# Patient Record
Sex: Male | Born: 1970 | Race: White | Hispanic: No | Marital: Married | Smoking: Current every day smoker
Health system: Southern US, Community
[De-identification: ages and names within clinical notes are randomized; demographics above are authoritative.]

## PROBLEM LIST (undated history)

## (undated) DIAGNOSIS — E7800 Pure hypercholesterolemia, unspecified: Secondary | ICD-10-CM

## (undated) DIAGNOSIS — F419 Anxiety disorder, unspecified: Secondary | ICD-10-CM

## (undated) DIAGNOSIS — K219 Gastro-esophageal reflux disease without esophagitis: Secondary | ICD-10-CM

## (undated) DIAGNOSIS — I10 Essential (primary) hypertension: Secondary | ICD-10-CM

## (undated) HISTORY — PX: ANAL FISSURE REPAIR: SHX2312

## (undated) HISTORY — PX: BACK SURGERY: SHX140

---

## 2010-07-21 ENCOUNTER — Emergency Department (HOSPITAL_COMMUNITY)
Admission: EM | Admit: 2010-07-21 | Discharge: 2010-07-21 | Payer: Self-pay | Source: Home / Self Care | Admitting: Emergency Medicine

## 2010-07-28 LAB — COMPREHENSIVE METABOLIC PANEL
ALT: 50 U/L (ref 0–53)
AST: 29 U/L (ref 0–37)
Alkaline Phosphatase: 64 U/L (ref 39–117)
BUN: 11 mg/dL (ref 6–23)
CO2: 25 mEq/L (ref 19–32)
Chloride: 104 mEq/L (ref 96–112)
GFR calc non Af Amer: >60 mL/min (ref >60)
Glucose, Bld: 111 mg/dL — ABNORMAL HIGH (ref 70–99)
Potassium: 3.8 mEq/L (ref 3.5–5.1)
Sodium: 137 mEq/L (ref 135–145)
Total Bilirubin: 0.4 mg/dL (ref 0.3–1.2)

## 2010-07-28 LAB — DIFFERENTIAL
Basophils Absolute: 0 10*3/uL (ref 0.0–0.1)
Basophils Relative: 0 % (ref 0–1)
Eosinophils Absolute: 0.1 10*3/uL (ref 0.0–0.7)
Eosinophils Relative: 1 % (ref 0–5)
Lymphocytes Relative: 27 % (ref 12–46)
Lymphs Abs: 1.7 10*3/uL (ref 0.7–4.0)
Monocytes Absolute: 0.2 10*3/uL (ref 0.1–1.0)
Monocytes Relative: 3 % (ref 3–12)
Neutro Abs: 4.2 10*3/uL (ref 1.7–7.7)
Neutrophils Relative %: 66 % (ref 43–77)

## 2010-07-28 LAB — CBC
HCT: 42.7 % (ref 39.0–52.0)
Hemoglobin: 15.1 g/dL (ref 13.0–17.0)
MCHC: 35.4 g/dL (ref 30.0–36.0)
MCV: 90.5 fL (ref 78.0–100.0)
Platelets: 215 10*3/uL (ref 150–400)
RBC: 4.72 MIL/uL (ref 4.22–5.81)
RDW: 12.5 % (ref 11.5–15.5)
WBC: 6.3 10*3/uL (ref 4.0–10.5)

## 2010-07-28 LAB — URINALYSIS, ROUTINE W REFLEX MICROSCOPIC
Bilirubin Urine: NEGATIVE
Hgb urine dipstick: NEGATIVE
Ketones, ur: NEGATIVE mg/dL
Nitrite: NEGATIVE
Protein, ur: NEGATIVE mg/dL
Specific Gravity, Urine: 1.005 (ref 1.005–1.030)
Urine Glucose, Fasting: NEGATIVE mg/dL
Urobilinogen, UA: 0.2 mg/dL (ref 0.0–1.0)
pH: 6 (ref 5.0–8.0)

## 2010-07-28 LAB — LIPASE, BLOOD: Lipase: 56 U/L (ref 11–59)

## 2014-09-14 ENCOUNTER — Other Ambulatory Visit: Payer: Self-pay | Admitting: Neurosurgery

## 2014-09-14 DIAGNOSIS — M5416 Radiculopathy, lumbar region: Secondary | ICD-10-CM

## 2014-10-03 ENCOUNTER — Ambulatory Visit
Admission: RE | Admit: 2014-10-03 | Discharge: 2014-10-03 | Disposition: A | Discharge status: Home / Self Care | Payer: BLUE CROSS/BLUE SHIELD | Source: Ambulatory Visit | Attending: Neurosurgery | Admitting: Neurosurgery

## 2014-10-03 DIAGNOSIS — M5416 Radiculopathy, lumbar region: Secondary | ICD-10-CM

## 2014-10-03 MED ORDER — GADOBENATE DIMEGLUMINE 529 MG/ML IV SOLN
20.0000 mL | Freq: Once | INTRAVENOUS | Status: AC | PRN
  Administered 2014-10-03: 20 mL via INTRAVENOUS

## 2015-03-04 ENCOUNTER — Encounter (HOSPITAL_COMMUNITY): Payer: Self-pay

## 2015-03-04 ENCOUNTER — Emergency Department (HOSPITAL_COMMUNITY): Payer: BLUE CROSS/BLUE SHIELD

## 2015-03-04 ENCOUNTER — Emergency Department (HOSPITAL_COMMUNITY)
Admission: EM | Admit: 2015-03-04 | Discharge: 2015-03-04 | Disposition: A | Discharge status: Home / Self Care | Payer: BLUE CROSS/BLUE SHIELD | Attending: Emergency Medicine | Admitting: Emergency Medicine

## 2015-03-04 DIAGNOSIS — I10 Essential (primary) hypertension: Secondary | ICD-10-CM | POA: Insufficient documentation

## 2015-03-04 DIAGNOSIS — R109 Unspecified abdominal pain: Secondary | ICD-10-CM

## 2015-03-04 DIAGNOSIS — R110 Nausea: Secondary | ICD-10-CM | POA: Diagnosis not present

## 2015-03-04 DIAGNOSIS — Z720 Tobacco use: Secondary | ICD-10-CM | POA: Insufficient documentation

## 2015-03-04 DIAGNOSIS — Z79899 Other long term (current) drug therapy: Secondary | ICD-10-CM | POA: Diagnosis not present

## 2015-03-04 DIAGNOSIS — F419 Anxiety disorder, unspecified: Secondary | ICD-10-CM | POA: Insufficient documentation

## 2015-03-04 DIAGNOSIS — R1012 Left upper quadrant pain: Secondary | ICD-10-CM | POA: Diagnosis not present

## 2015-03-04 DIAGNOSIS — Z8719 Personal history of other diseases of the digestive system: Secondary | ICD-10-CM | POA: Insufficient documentation

## 2015-03-04 DIAGNOSIS — R51 Headache: Secondary | ICD-10-CM | POA: Insufficient documentation

## 2015-03-04 DIAGNOSIS — E780 Pure hypercholesterolemia: Secondary | ICD-10-CM | POA: Insufficient documentation

## 2015-03-04 DIAGNOSIS — R1032 Left lower quadrant pain: Secondary | ICD-10-CM | POA: Diagnosis not present

## 2015-03-04 DIAGNOSIS — Z792 Long term (current) use of antibiotics: Secondary | ICD-10-CM | POA: Diagnosis not present

## 2015-03-04 HISTORY — DX: Essential (primary) hypertension: I10

## 2015-03-04 HISTORY — DX: Gastro-esophageal reflux disease without esophagitis: K21.9

## 2015-03-04 HISTORY — DX: Anxiety disorder, unspecified: F41.9

## 2015-03-04 HISTORY — DX: Pure hypercholesterolemia, unspecified: E78.00

## 2015-03-04 LAB — CBC WITH DIFFERENTIAL/PLATELET
Basophils Absolute: 0 10*3/uL (ref 0.0–0.1)
Basophils Relative: 0 % (ref 0–1)
Eosinophils Absolute: 0.1 10*3/uL (ref 0.0–0.7)
Eosinophils Relative: 1 % (ref 0–5)
HCT: 44.6 % (ref 39.0–52.0)
Hemoglobin: 15.7 g/dL (ref 13.0–17.0)
Lymphocytes Relative: 31 % (ref 12–46)
Lymphs Abs: 2.5 10*3/uL (ref 0.7–4.0)
MCH: 32.8 pg (ref 26.0–34.0)
MCHC: 35.2 g/dL (ref 30.0–36.0)
MCV: 93.1 fL (ref 78.0–100.0)
Monocytes Absolute: 0.5 10*3/uL (ref 0.1–1.0)
Monocytes Relative: 7 % (ref 3–12)
Neutro Abs: 4.9 10*3/uL (ref 1.7–7.7)
Neutrophils Relative %: 61 % (ref 43–77)
Platelets: 220 10*3/uL (ref 150–400)
RBC: 4.79 MIL/uL (ref 4.22–5.81)
RDW: 12.5 % (ref 11.5–15.5)
WBC: 7.9 10*3/uL (ref 4.0–10.5)

## 2015-03-04 LAB — URINALYSIS, ROUTINE W REFLEX MICROSCOPIC
Bilirubin Urine: NEGATIVE
Glucose, UA: NEGATIVE mg/dL
Hgb urine dipstick: NEGATIVE
Ketones, ur: NEGATIVE mg/dL
Leukocytes, UA: NEGATIVE
Nitrite: NEGATIVE
Protein, ur: NEGATIVE mg/dL
Specific Gravity, Urine: <1.005 — ABNORMAL LOW (ref 1.005–1.030)
Urobilinogen, UA: 0.2 mg/dL (ref 0.0–1.0)
pH: 6.5 (ref 5.0–8.0)

## 2015-03-04 LAB — BASIC METABOLIC PANEL
Anion gap: 5 (ref 5–15)
BUN: 13 mg/dL (ref 6–20)
CO2: 28 mmol/L (ref 22–32)
Calcium: 9.1 mg/dL (ref 8.9–10.3)
Chloride: 102 mmol/L (ref 101–111)
Creatinine, Ser: 0.97 mg/dL (ref 0.61–1.24)
GFR calc Af Amer: >60 mL/min (ref >60)
GFR calc non Af Amer: >60 mL/min (ref >60)
Glucose, Bld: 93 mg/dL (ref 65–99)
Potassium: 3.6 mmol/L (ref 3.5–5.1)
Sodium: 135 mmol/L (ref 135–145)

## 2015-03-04 LAB — HEPATIC FUNCTION PANEL
ALT: 36 U/L (ref 17–63)
AST: 27 U/L (ref 15–41)
Albumin: 4.1 g/dL (ref 3.5–5.0)
Alkaline Phosphatase: 60 U/L (ref 38–126)
Bilirubin, Direct: 0.1 mg/dL (ref 0.1–0.5)
Indirect Bilirubin: 0.4 mg/dL (ref 0.3–0.9)
Total Bilirubin: 0.5 mg/dL (ref 0.3–1.2)
Total Protein: 6.9 g/dL (ref 6.5–8.1)

## 2015-03-04 LAB — BRAIN NATRIURETIC PEPTIDE: B Natriuretic Peptide: 11 pg/mL (ref 0.0–100.0)

## 2015-03-04 LAB — LIPASE, BLOOD: Lipase: 41 U/L (ref 22–51)

## 2015-03-04 MED ORDER — IOHEXOL 300 MG/ML  SOLN
100.0000 mL | Freq: Once | INTRAMUSCULAR | Status: AC | PRN
  Administered 2015-03-04: 100 mL via INTRAVENOUS

## 2015-03-04 MED ORDER — HYDROCODONE-ACETAMINOPHEN 5-325 MG PO TABS: 1.0000 | ORAL_TABLET | ORAL | Status: AC | PRN

## 2015-03-04 MED ORDER — IOHEXOL 300 MG/ML  SOLN
25.0000 mL | Freq: Once | INTRAMUSCULAR | Status: AC | PRN
  Administered 2015-03-04: 25 mL via ORAL

## 2015-03-04 MED ORDER — ONDANSETRON HCL 8 MG PO TABS: 8.0000 mg | ORAL_TABLET | Freq: Three times a day (TID) | ORAL | Status: AC | PRN

## 2015-03-04 MED ORDER — SODIUM CHLORIDE 0.9 % IV BOLUS (SEPSIS)
1000.0000 mL | Freq: Once | INTRAVENOUS | Status: AC
  Administered 2015-03-04: 1000 mL via INTRAVENOUS

## 2015-03-04 MED ORDER — SODIUM CHLORIDE 0.9 % IV SOLN
INTRAVENOUS | Status: DC
  Administered 2015-03-04: 12:00:00 via INTRAVENOUS

## 2015-03-04 NOTE — Discharge Instructions (Signed)

## 2015-03-04 NOTE — ED Notes (Signed)
Pt states he was on flagyl, cipro and bentyl but he stopped taking them yesterday because they made him sick on his stomach

## 2015-03-04 NOTE — ED Notes (Signed)
Pt now states he was told he has three spots on his liver. States he has been SOB and not able to work

## 2015-03-04 NOTE — ED Notes (Signed)
Pt is complaining of abdominal pain and dizziness for about two weeks. States he was told at [facility] he has a stomach infection and vertigo. States he was told he needed a colonoscopy and it was to be scheduled this week. States he is tired of fooling with them and he is not getting any better

## 2015-03-04 NOTE — ED Provider Notes (Signed)
CSN: [id number]     Arrival date & time 03/04/15  0914 History  This chart was scribed for Mancel Bale, MD by Jarvis Morgan, ED Scribe. This patient was seen in room APA01/APA01 and the patient's care was started at 11:30 AM.      Chief Complaint  Patient presents with  . Abdominal Pain   The history is provided by the patient. No language interpreter was used.    HPI Comments: Cleveland Yarbro is a 44 y.o. male who presents to the Emergency Department complaining of intermittent, moderate, dizziness onset 2 months. Pt reports associated abdominal pain, nausea, and HA. Pt was at [facility] 2 weeks ago and was told he had a stomach infection and vertigo. He states he has been taking Flagyl, Cipro and dicyclomine but had to discontinue this medication because it was causing him to feel sick. Pt was instructed that he needed a colonoscopy and needed it to be scheduled this week. He states he was told by Dr. Allena Katz that he has 3 spots on his liver. He is a current everyday smoker. He states he has had a workup for seizures but the results did not show any seizure disorder. Pt notes he has a h/o chronic back pain and had back surgery 1 year ago. He denies any constipation.    Past Medical History  Diagnosis Date  . Hypertension   . Anxiety   . High cholesterol   . Acid reflux    Past Surgical History  Procedure Laterality Date  . Back surgery    . Anal fissure repair     No family history on file. Social History  Substance Use Topics  . Smoking status: Current Every Day Smoker  . Smokeless tobacco: None  . Alcohol Use: No    Review of Systems  Gastrointestinal: Positive for nausea and abdominal pain. Negative for constipation.  Neurological: Positive for dizziness and headaches.  All other systems reviewed and are negative.     Allergies  Daypro  Home Medications   Prior to Admission medications   Medication Sig Start Date End Date Taking? Authorizing Provider   ALPRAZolam Prudy Feeler) 0.5 MG tablet Take 0.5 mg by mouth 2 (two) times daily as needed for anxiety.  02/06/15  Yes Historical Provider, MD  ciprofloxacin (CIPRO) 500 MG tablet Take 500 mg by mouth 2 (two) times daily. 02/27/15  Yes Historical Provider, MD  dicyclomine (BENTYL) 10 MG capsule Take 10 mg by mouth 3 (three) times daily as needed for spasms.  02/27/15  Yes Historical Provider, MD  etodolac (LODINE) 400 MG tablet Take 400 mg by mouth 2 (two) times daily as needed for mild pain.  01/05/15  Yes Historical Provider, MD  HYDROcodone-acetaminophen (NORCO) 10-325 MG per tablet Take 1 tablet by mouth every 6 (six) hours as needed. for pain 11/29/14  Yes Historical Provider, MD  lisinopril-hydrochlorothiazide (PRINZIDE,ZESTORETIC) 20-12.5 MG per tablet Take 1 tablet by mouth daily. 02/13/15  Yes Historical Provider, MD  metroNIDAZOLE (FLAGYL) 500 MG tablet Take 500 mg by mouth 3 (three) times daily. 02/27/15  Yes Historical Provider, MD  simvastatin (ZOCOR) 20 MG tablet Take 20 mg by mouth daily. 02/01/15  Yes Historical Provider, MD  diazepam (VALIUM) 5 MG tablet Take 5 mg by mouth every 8 (eight) hours as needed for muscle spasms.  02/02/15   Historical Provider, MD  polyethylene glycol powder (GLYCOLAX/MIRALAX) powder Take 17 g by mouth daily as needed for mild constipation.  02/27/15   Historical Provider, MD  Triage Vitals: BP 110/74 mmHg  Pulse 73  Temp(Src) 97.7 F (36.5 C) (Oral)  Resp 18  Ht 5\' 10"  (1.778 m)  Wt 218 lb (98.884 kg)  BMI 31.28 kg/m2  SpO2 93%  Physical Exam  Constitutional: He is oriented to person, place, and time. He appears well-developed and well-nourished.  HENT:  Head: Normocephalic and atraumatic.  Right Ear: External ear normal.  Left Ear: External ear normal.  Eyes: Conjunctivae and EOM are normal. Pupils are equal, round, and reactive to light.  Neck: Normal range of motion and phonation normal. Neck supple.  Cardiovascular: Normal rate, regular rhythm and  normal heart sounds.   Pulmonary/Chest: Effort normal and breath sounds normal. He exhibits no bony tenderness.  Abdominal: Soft. Bowel sounds are increased. There is tenderness (moderate) in the left upper quadrant and left lower quadrant. There is no rebound.  Musculoskeletal: Normal range of motion.  Neurological: He is alert and oriented to person, place, and time. No cranial nerve deficit or sensory deficit. He exhibits normal muscle tone. Coordination normal.  Skin: Skin is warm, dry and intact.  Psychiatric: He has a normal mood and affect. His behavior is normal. Judgment and thought content normal.  Nursing note and vitals reviewed.   ED Course  Procedures (including critical care time)  Medications  0.9 %  sodium chloride infusion (not administered)  sodium chloride 0.9 % bolus 1,000 mL (1,000 mLs Intravenous New Bag/Given 03/04/15 1042)   Patient Vitals for the past 24 hrs:  BP Temp Temp src Pulse Resp SpO2 Height Weight  03/04/15 1123 106/62 mmHg - - 63 16 97 % - -  03/04/15 1115 - - - 62 16 100 % - -  03/04/15 1191 - - - - - - 5\' 10"  (1.778 m) 218 lb (98.884 kg)  03/04/15 0927 110/74 mmHg 97.7 F (36.5 C) Oral 73 18 93 % - -   DIAGNOSTIC STUDIES: Oxygen Saturation is 93% on RA, adequate by my interpretation.    COORDINATION OF CARE:  2:27 PM Reevaluation with update and discussion. After initial assessment and treatment, an updated evaluation reveals patient is comfortable. He and his wife, state that he has been dizzy, for any months. He has a follow-up plan with his GI doctor, in 2 days time. All findings discussed with patient and family and all questions answered. Glorian Mcdonell L   Note:- Records were requested from his primary care doctor, but never arrived prior to the time of discharge.  Labs Review Labs Reviewed  URINALYSIS, ROUTINE W REFLEX MICROSCOPIC (NOT AT [facility]) - Abnormal; Notable for the following:    Specific Gravity, Urine <1.005 (*)    All other  components within normal limits  CBC WITH DIFFERENTIAL/PLATELET  BASIC METABOLIC PANEL  BRAIN NATRIURETIC PEPTIDE  HEPATIC FUNCTION PANEL  LIPASE, BLOOD    Imaging Review Dg Chest 2 View  03/04/2015   CLINICAL DATA:  Shortness of breath and dizziness 2-3 months. Hypertension, smoker.  EXAM: CHEST  2 VIEW  COMPARISON:  07/21/2010  FINDINGS: Lungs are adequately inflated without consolidation or effusion. Cardiomediastinal silhouette is within normal. Bones and soft tissues are unchanged.  IMPRESSION: No active cardiopulmonary disease.   Electronically Signed   By: Elberta Fortis M.D.   On: 03/04/2015 11:14   I have personally reviewed and evaluated these images and lab results as part of my medical decision-making.   EKG Interpretation   Date/Time:  Monday March 04 2015 09:25:47 EDT Ventricular Rate:  71 PR Interval:  135 QRS Duration: 90 QT Interval:  381 QTC Calculation: 414 R Axis:   67 Text Interpretation:  Sinus rhythm RSR' in V1 or V2, probably normal  variant No old tracing to compare Confirmed by [facility]  MD, Aashrith Eves [phone])  on 03/04/2015 10:45:54 AM      MDM   Final diagnoses:  Abdominal pain, unspecified abdominal location    Chronic abdominal pain, with worsening after starting on antibody. Doubt colitis, serious bacterial infection, metabolic instability or impending vascular collapse.  Nursing Notes Reviewed/ Care Coordinated Applicable Imaging Reviewed Interpretation of Laboratory Data incorporated into ED treatment  The patient appears reasonably screened and/or stabilized for discharge and I doubt any other medical condition or other [facility] requiring further screening, evaluation, or treatment in the ED at this time prior to discharge.  Plan: Home Medications- Norco, Zofran; Home Treatments- Rest; return here if the recommended treatment, does not improve the symptoms; Recommended follow up- PCP prn   I personally performed the services described in this  documentation, which was scribed in my presence. The recorded information has been reviewed and is accurate.       Mancel Bale, MD 03/04/15 [phone]

## 2016-04-16 IMAGING — CT CT ABD-PELV W/ CM
2 of 5 series · 16 of 46 positions shown, 18 images · IV contrast (Omnipaque 300)
Comparison: 07/21/2010

CLINICAL DATA: Abdominal pain, dizziness and nausea 2 weeks.

EXAM:
CT ABDOMEN AND PELVIS WITH CONTRAST
TECHNIQUE: Multidetector CT imaging of the abdomen and pelvis was performed
using the standard protocol following bolus administration of
intravenous contrast.
CONTRAST:  25mL OMNIPAQUE IOHEXOL 300 MG/ML SOLN, 100mL OMNIPAQUE
IOHEXOL 300 MG/ML SOLN

[Series 2: abd_pel_with 5.0 b40f · axial · 0.74mm/px · z∈[-520,-50]mm · 13 of 108 slices shown, 15 images]
[im 7/108  soft-tissue]
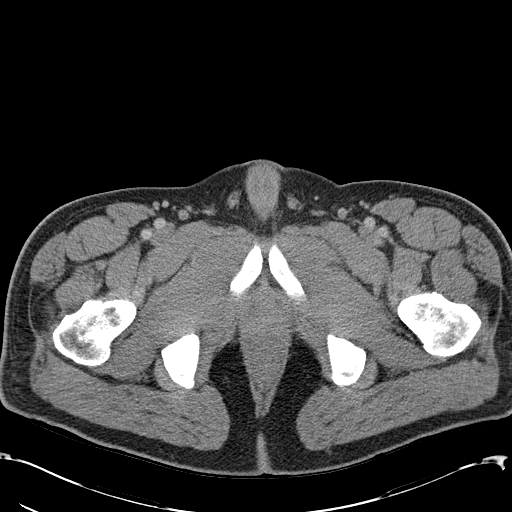
[im 7/108  bone]
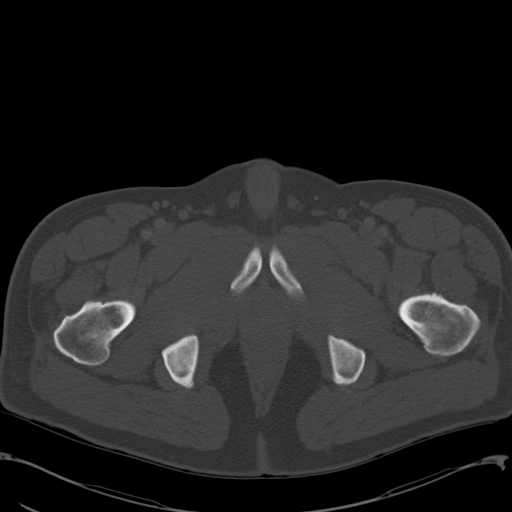
[im 13/108  soft-tissue]
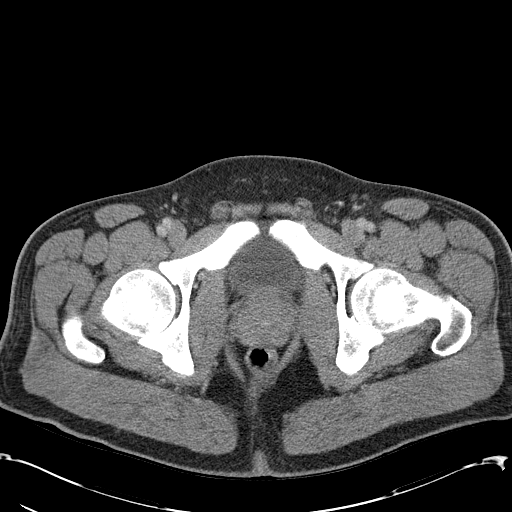
[im 26/108  soft-tissue]
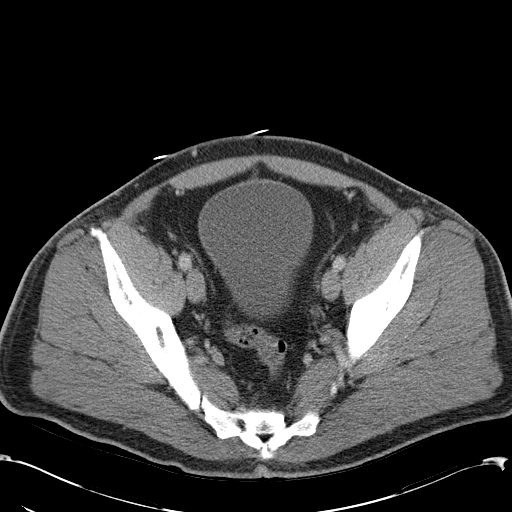
[im 32/108  soft-tissue]
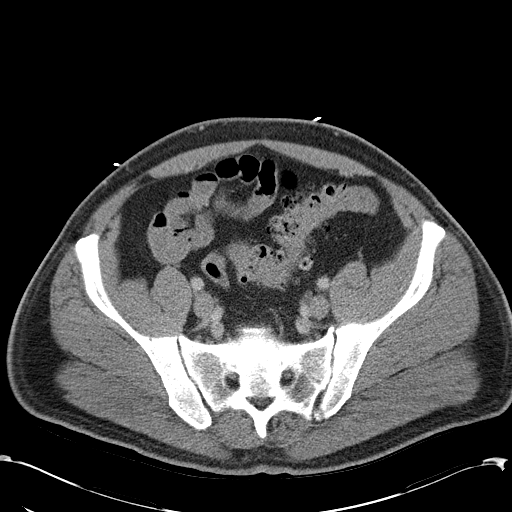
[im 38/108  soft-tissue]
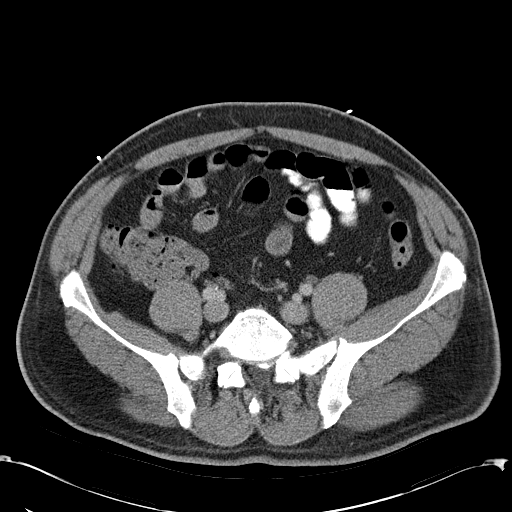
[im 45/108  soft-tissue]
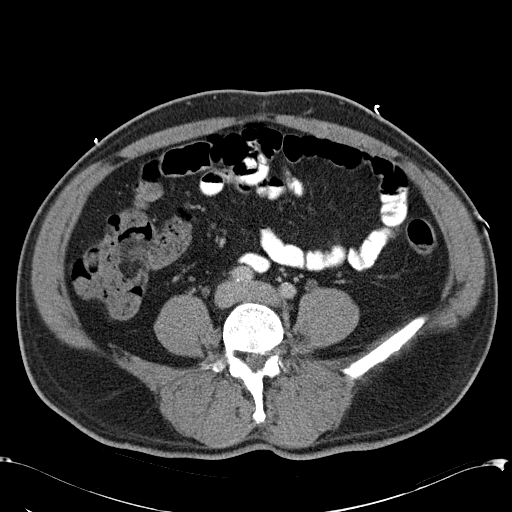
[im 57/108  soft-tissue]
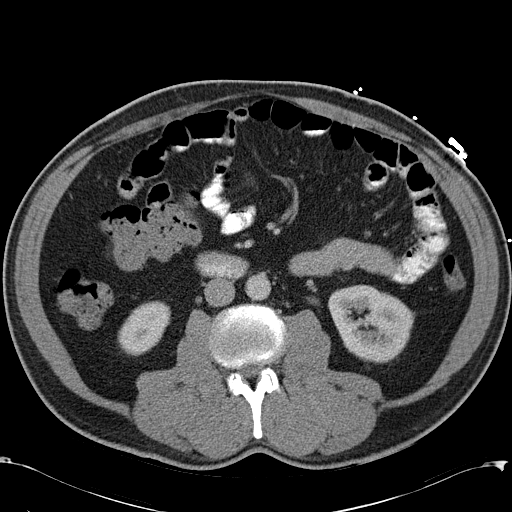
[im 63/108  soft-tissue]
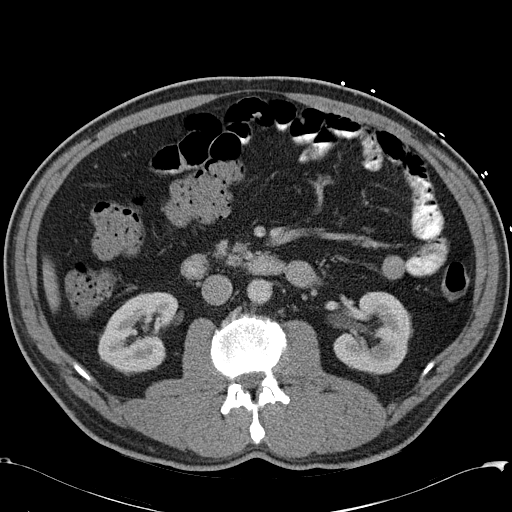
[im 70/108  soft-tissue]
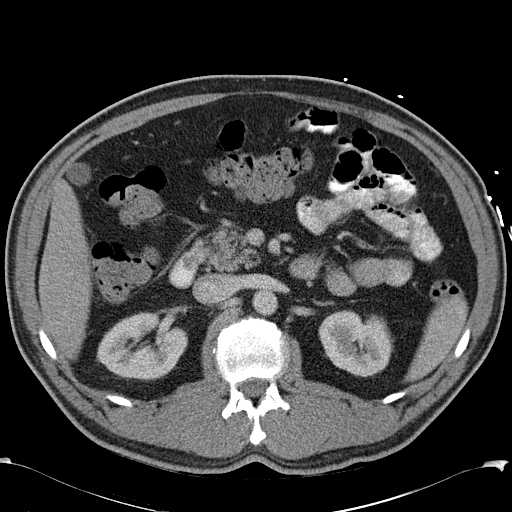
[im 70/108  bone]
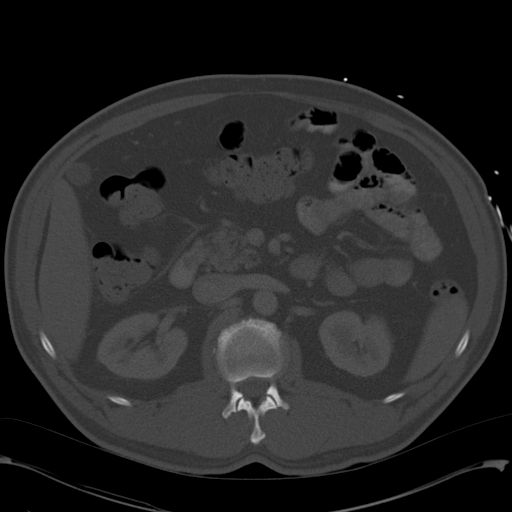
[im 76/108  soft-tissue]
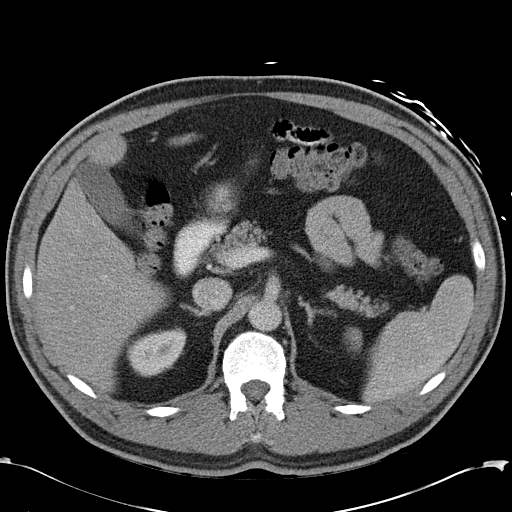
[im 82/108  soft-tissue]
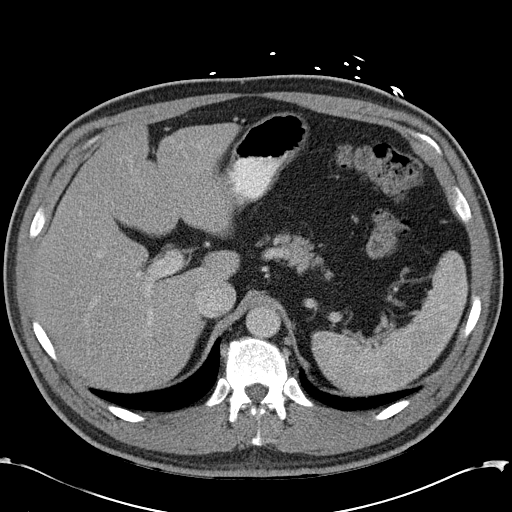
[im 95/108  soft-tissue]
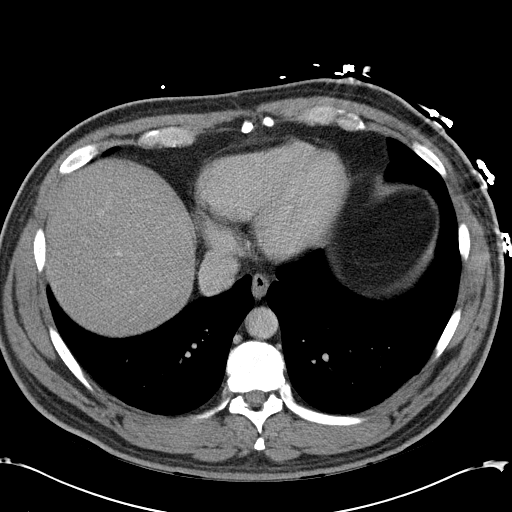
[im 101/108  soft-tissue]
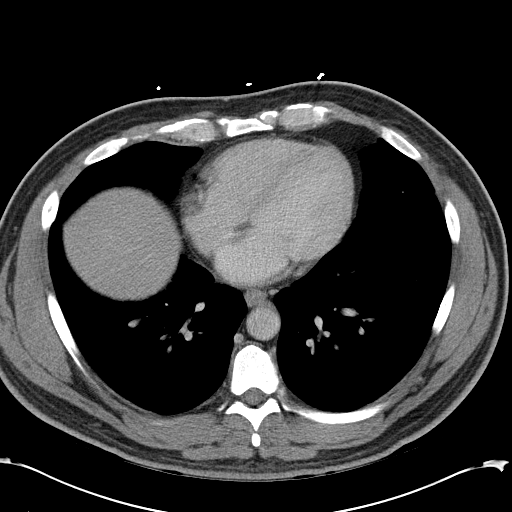

[Series 5: abd_pel_with 3.0 spo · coronal · 0.82mm/px · 3 of 100 slices shown]
[im 34/100  soft-tissue]
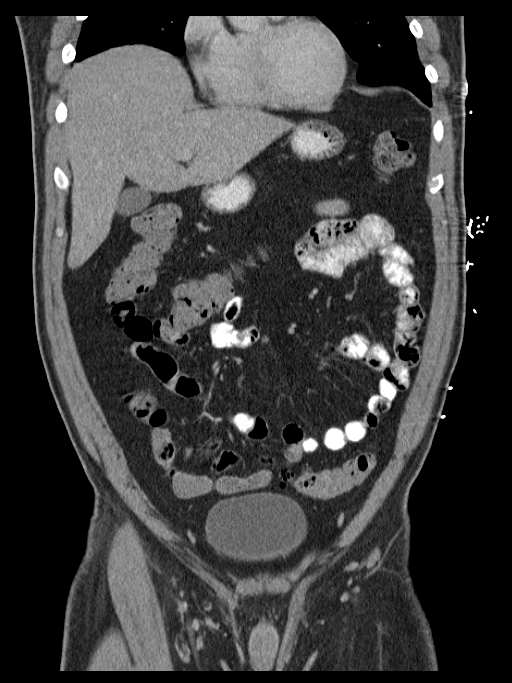
[im 45/100  soft-tissue]
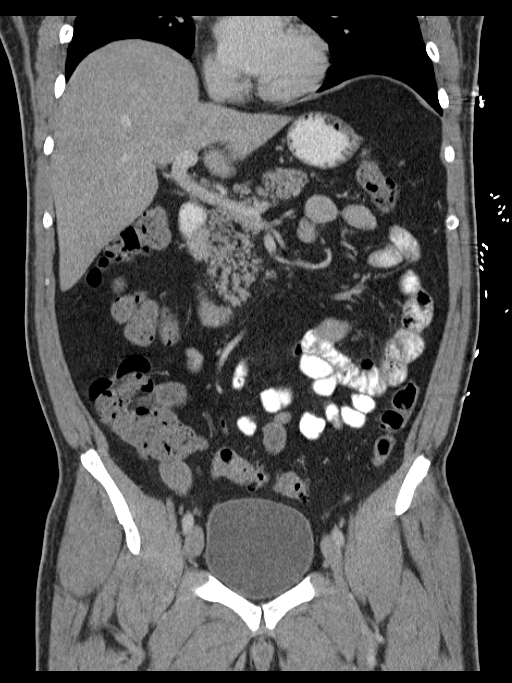
[im 56/100  soft-tissue]
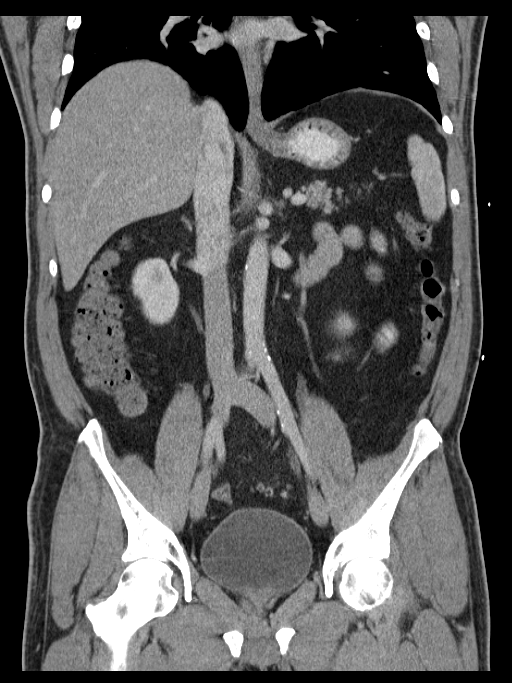

[16 of 46 positions shown; findings below may reference images not displayed]

FINDINGS: Lung bases are within normal.

Abdominal images demonstrate a sub cm cyst over the dome of the
right lobe of the liver. There are a few additional hypodensities
within the liver with the largest measuring 2.9 cm over the lateral
segment of the left lobe unchanged likely hemangiomas.

The spleen, pancreas, gallbladder and adrenal glands are within
normal. Kidneys are normal in size without hydronephrosis or
nephrolithiasis. Ureters are within normal. Appendix is normal.

There is very minimal calcified plaque over the abdominal aorta and
iliac arteries.

There is mild to moderate diverticulosis of the colon most prominent
over the sigmoid colon. Small bowel is within normal. The mesentery
is normal. There is no free fluid or focal inflammatory change.

Pelvic images demonstrate a normal bladder, prostate and rectum.
There is minimal degenerate change of the spine.
IMPRESSION: No acute findings in the abdomen/pelvis.

A few small liver hypodensities unchanged likely hemangiomas with
likely sub cm cyst over the dome of the right lobe of the liver.

Mild diverticulosis of the colon most prominent over the sigmoid
colon. No active inflammation.

## 2016-04-16 IMAGING — DX DG CHEST 2V
2 series · 2 of 2 positions shown · non-contrast
Comparison: 07/21/2010

CLINICAL DATA: Shortness of breath and dizziness 2-3 months.
Hypertension, smoker.

EXAM:
CHEST  2 VIEW

[chest pa]
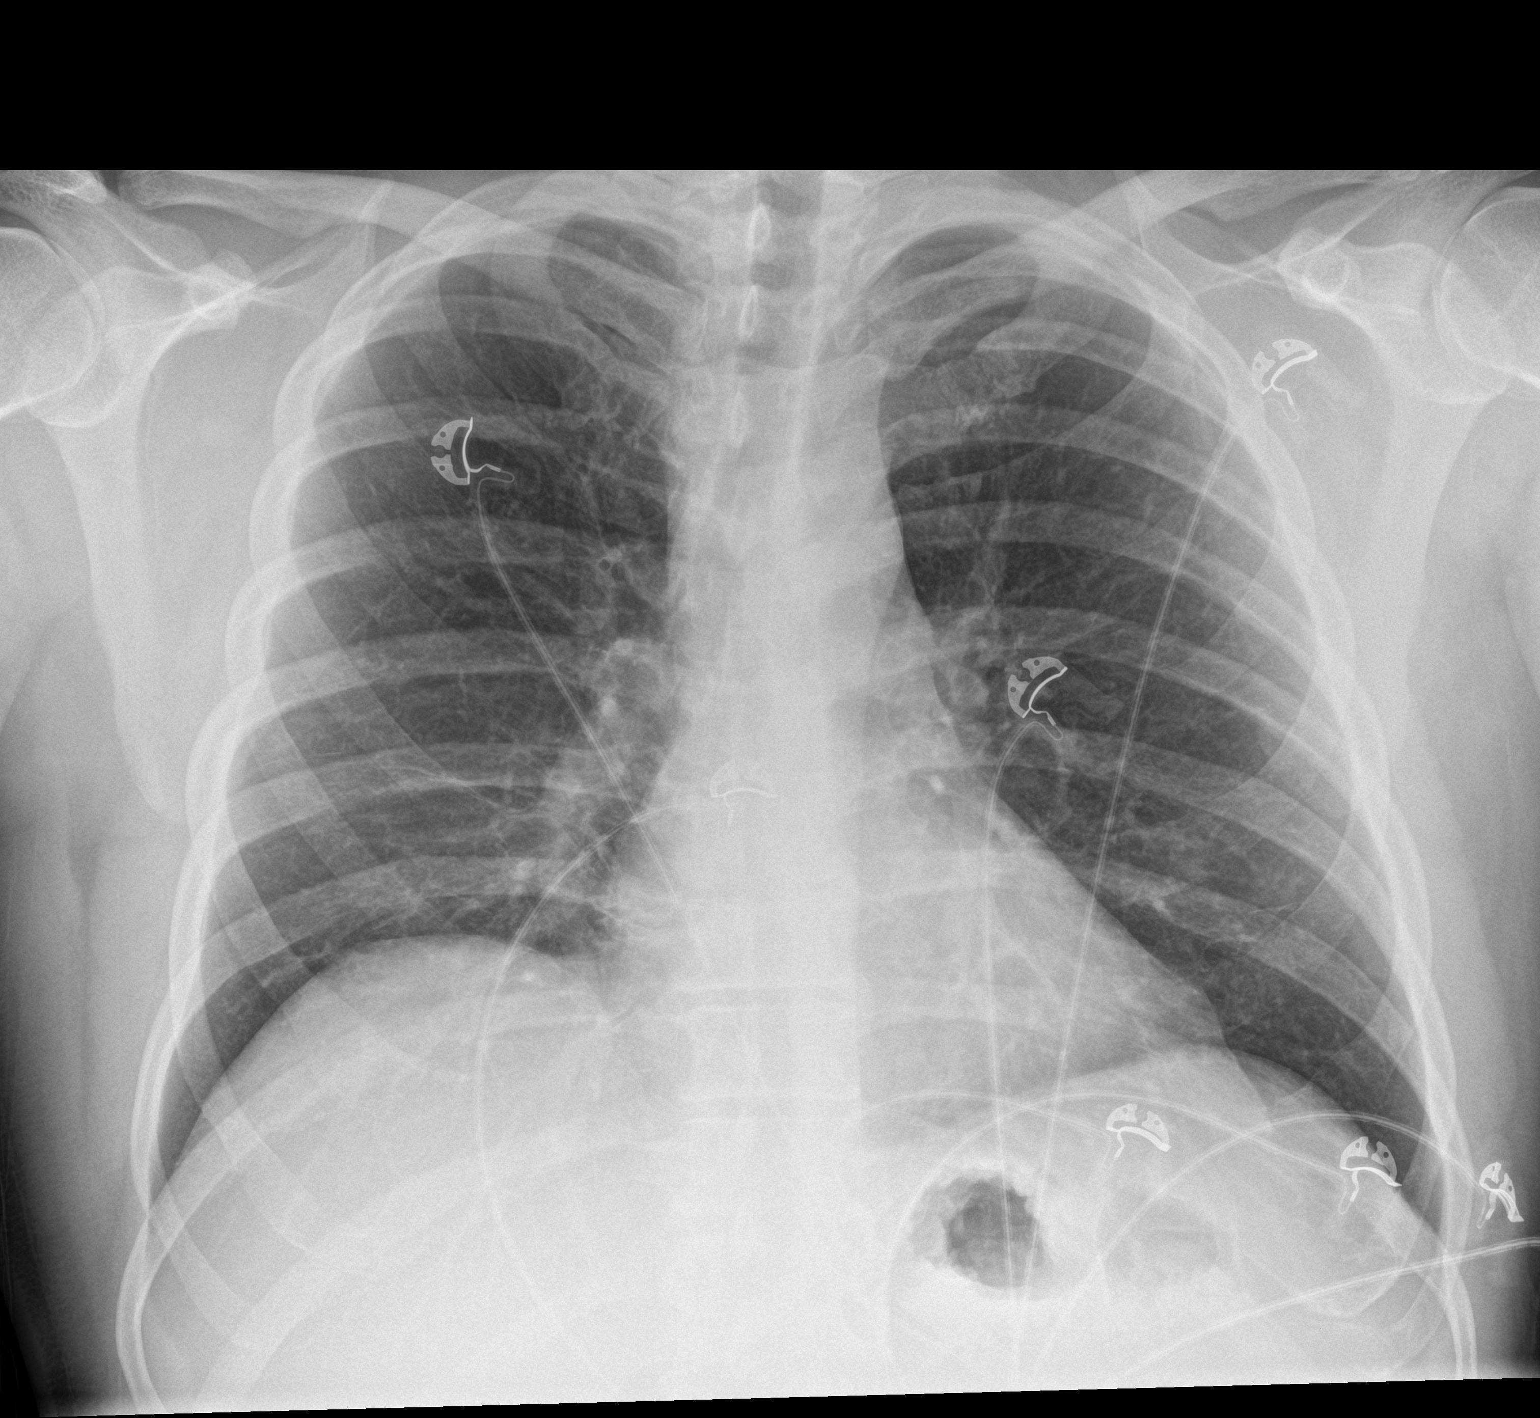

[chest lat]
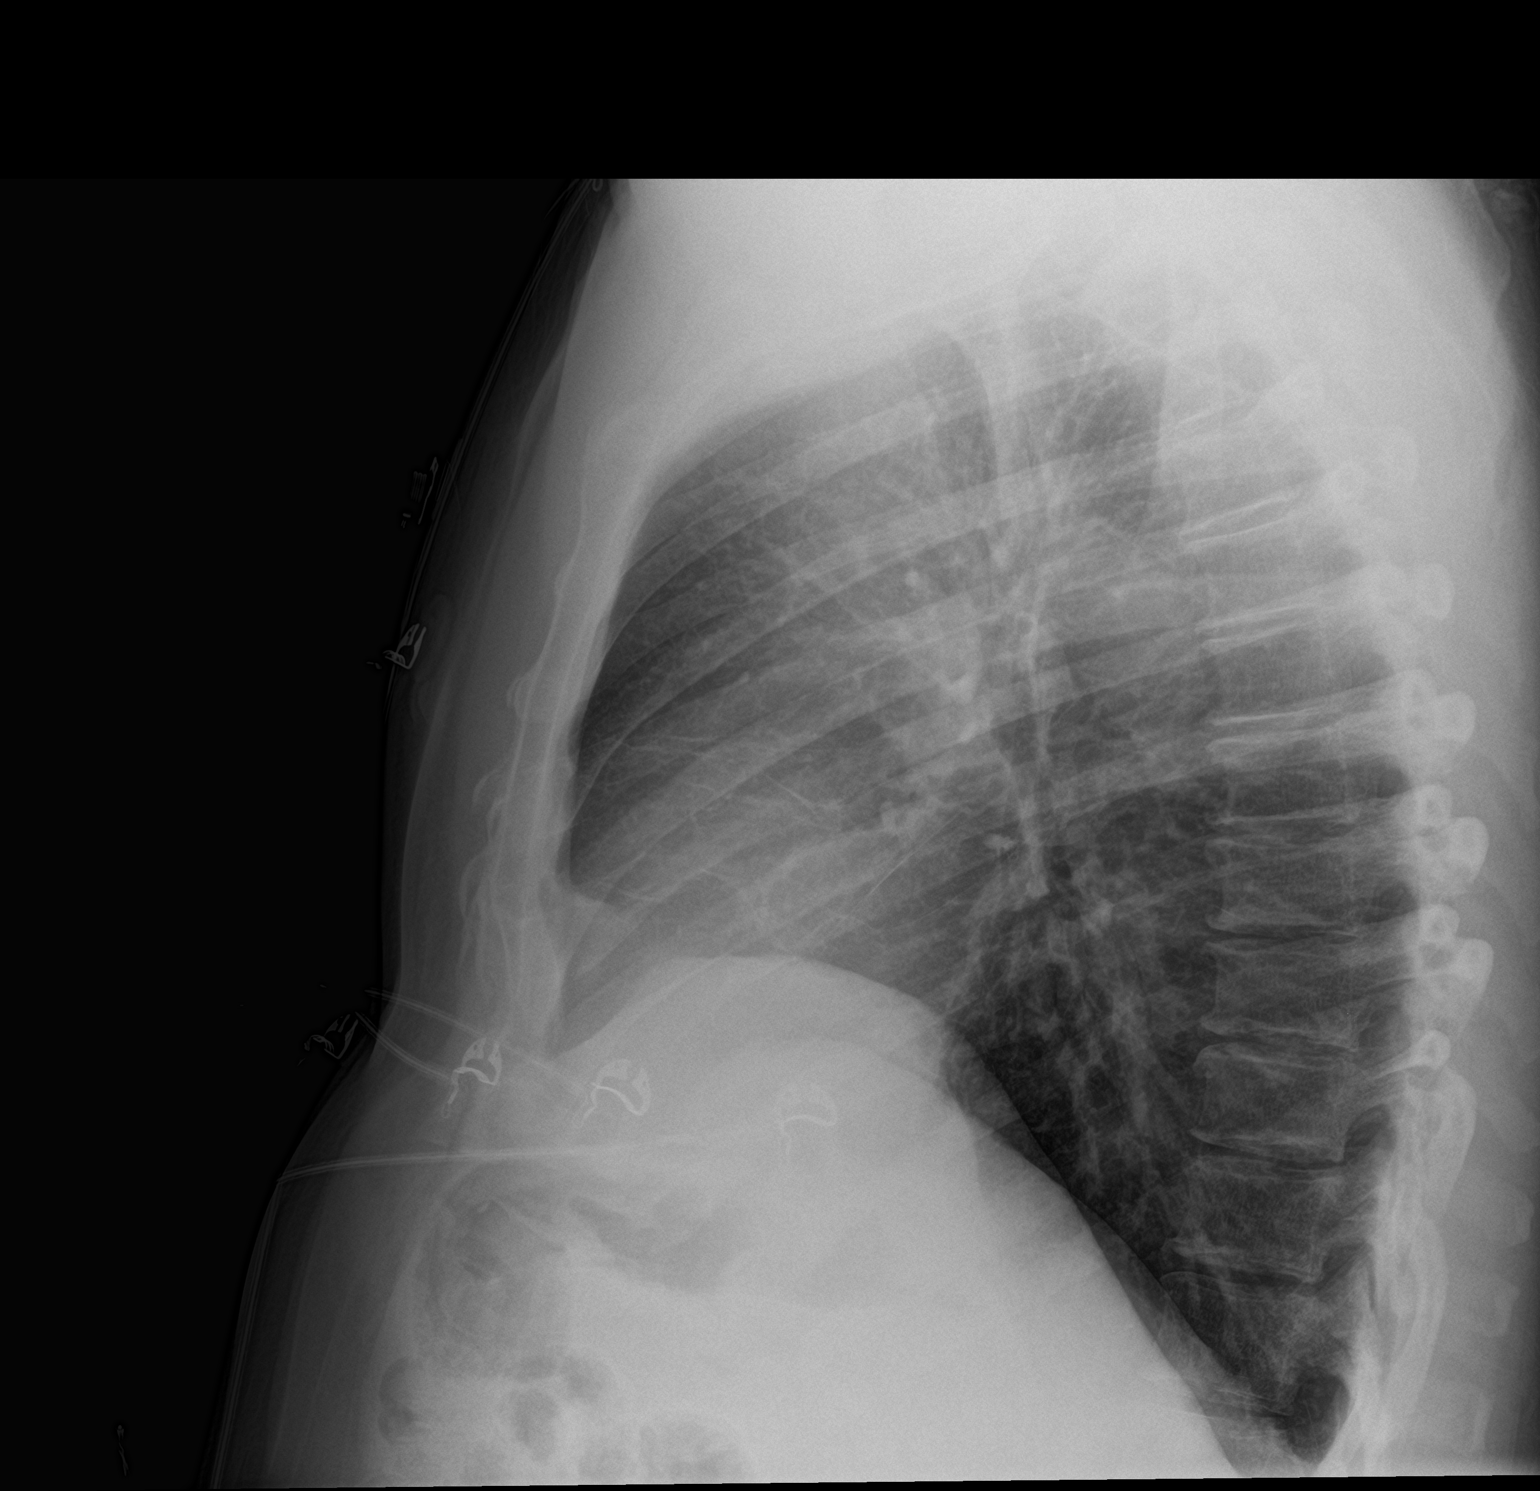

[2 of 2 positions shown; findings below may reference images not displayed]

FINDINGS: Lungs are adequately inflated without consolidation or effusion.
Cardiomediastinal silhouette is within normal. Bones and soft
tissues are unchanged.
IMPRESSION: No active cardiopulmonary disease.
